# Patient Record
Sex: Male | Born: 2005 | Race: White | Hispanic: No | Marital: Single
Health system: Southern US, Community
[De-identification: ages and names within clinical notes are randomized; demographics above are authoritative.]

---
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
## 2023-09-24 ENCOUNTER — Emergency Department (HOSPITAL_COMMUNITY)
Admission: EM | Admit: 2023-09-24 | Discharge: 2023-09-25 | Disposition: A | Discharge status: Home / Self Care | Payer: 59 | Attending: Emergency Medicine | Admitting: Emergency Medicine

## 2023-09-24 ENCOUNTER — Other Ambulatory Visit: Payer: Self-pay

## 2023-09-24 DIAGNOSIS — D72829 Elevated white blood cell count, unspecified: Secondary | ICD-10-CM | POA: Insufficient documentation

## 2023-09-24 DIAGNOSIS — R197 Diarrhea, unspecified: Secondary | ICD-10-CM | POA: Diagnosis not present

## 2023-09-24 DIAGNOSIS — R1084 Generalized abdominal pain: Secondary | ICD-10-CM | POA: Diagnosis not present

## 2023-09-24 DIAGNOSIS — R112 Nausea with vomiting, unspecified: Secondary | ICD-10-CM | POA: Insufficient documentation

## 2023-09-24 DIAGNOSIS — E876 Hypokalemia: Secondary | ICD-10-CM | POA: Diagnosis not present

## 2023-09-24 LAB — COMPREHENSIVE METABOLIC PANEL
ALT: 36 U/L (ref 0–44)
AST: 19 U/L (ref 15–41)
Albumin: 4.2 g/dL (ref 3.5–5.0)
Alkaline Phosphatase: 92 U/L (ref 38–126)
Anion gap: 13 (ref 5–15)
BUN: 7 mg/dL (ref 6–20)
CO2: 23 mmol/L (ref 22–32)
Calcium: 9.1 mg/dL (ref 8.9–10.3)
Chloride: 102 mmol/L (ref 98–111)
Creatinine, Ser: 0.81 mg/dL (ref 0.61–1.24)
GFR, Estimated: >60 mL/min (ref >60)
Glucose, Bld: 97 mg/dL (ref 70–99)
Potassium: 3.2 mmol/L — ABNORMAL LOW (ref 3.5–5.1)
Sodium: 138 mmol/L (ref 135–145)
Total Bilirubin: 1.2 mg/dL — ABNORMAL HIGH (ref <1.2)
Total Protein: 7.6 g/dL (ref 6.5–8.1)

## 2023-09-24 LAB — CBC WITH DIFFERENTIAL/PLATELET
Abs Immature Granulocytes: 0.06 10*3/uL (ref 0.00–0.07)
Basophils Absolute: 0 10*3/uL (ref 0.0–0.1)
Basophils Relative: 0 %
Eosinophils Absolute: 0 10*3/uL (ref 0.0–0.5)
Eosinophils Relative: 0 %
HCT: 43.6 % (ref 39.0–52.0)
Hemoglobin: 15.2 g/dL (ref 13.0–17.0)
Immature Granulocytes: 1 %
Lymphocytes Relative: 10 %
Lymphs Abs: 1.1 10*3/uL (ref 0.7–4.0)
MCH: 29.1 pg (ref 26.0–34.0)
MCHC: 34.9 g/dL (ref 30.0–36.0)
MCV: 83.5 fL (ref 80.0–100.0)
Monocytes Absolute: 0.6 10*3/uL (ref 0.1–1.0)
Monocytes Relative: 6 %
Neutro Abs: 9.4 10*3/uL — ABNORMAL HIGH (ref 1.7–7.7)
Neutrophils Relative %: 83 %
Platelets: 332 10*3/uL (ref 150–400)
RBC: 5.22 MIL/uL (ref 4.22–5.81)
RDW: 12.4 % (ref 11.5–15.5)
WBC: 11.2 10*3/uL — ABNORMAL HIGH (ref 4.0–10.5)
nRBC: 0 % (ref 0.0–0.2)

## 2023-09-24 LAB — LIPASE, BLOOD: Lipase: 33 U/L (ref 11–51)

## 2023-09-24 MED ORDER — ONDANSETRON HCL 4 MG/2ML IJ SOLN
4.0000 mg | Freq: Once | INTRAMUSCULAR | Status: AC
  Administered 2023-09-24: 4 mg via INTRAVENOUS
  Filled 2023-09-24: qty 2

## 2023-09-24 MED ORDER — FENTANYL CITRATE PF 50 MCG/ML IJ SOSY
50.0000 ug | PREFILLED_SYRINGE | Freq: Once | INTRAMUSCULAR | Status: AC
Start: 2023-09-24 — End: 2023-09-24
  Administered 2023-09-24: 50 ug via INTRAVENOUS
  Filled 2023-09-24: qty 1

## 2023-09-24 NOTE — ED Triage Notes (Signed)
Pt arrived POV d/t RLQ pain with N/V/D - onset 0300 but worsened at 1800 with pain/ pressure.

## 2023-09-24 NOTE — ED Provider Triage Note (Signed)
Emergency Medicine Provider Triage Evaluation Note  Jerome Le , a 17 y.o. male  was evaluated in triage.  Pt complains of right lower quadrant abdominal pain onset today, does not radiate, no testicle or flank pain, no urinary symptoms, no previous surgeries to the abdomen. Vomiting  x 2.  Review of Systems  Positive: Abdominal pain Negative: fever  Physical Exam  BP 129/61 (BP Location: Right Arm)   Pulse 80   Temp 98.9 F (37.2 C) (Oral)   Resp 17   SpO2 98%  Gen:   Awake, no distress   Resp:  Normal effort  MSK:   Moves extremities without difficulty  Other:  RLQ tenderness  Medical Decision Making  Medically screening exam initiated at 8:23 PM.  Appropriate orders placed.  Jerome Le was informed that the remainder of the evaluation will be completed by another provider, this initial triage assessment does not replace that evaluation, and the importance of remaining in the ED until their evaluation is complete.     Arthor Captain, PA-C 09/24/23 2025

## 2023-09-25 ENCOUNTER — Emergency Department (HOSPITAL_COMMUNITY): Payer: 59

## 2023-09-25 ENCOUNTER — Other Ambulatory Visit (HOSPITAL_COMMUNITY): Payer: 59

## 2023-09-25 LAB — URINALYSIS, ROUTINE W REFLEX MICROSCOPIC
Bilirubin Urine: NEGATIVE
Glucose, UA: NEGATIVE mg/dL
Hgb urine dipstick: NEGATIVE
Ketones, ur: NEGATIVE mg/dL
Leukocytes,Ua: NEGATIVE
Nitrite: NEGATIVE
Protein, ur: NEGATIVE mg/dL
Specific Gravity, Urine: >1.046 — ABNORMAL HIGH (ref 1.005–1.030)
pH: 5 (ref 5.0–8.0)

## 2023-09-25 MED ORDER — ONDANSETRON 4 MG PO TBDP: 4.0000 mg | ORAL_TABLET | Freq: Three times a day (TID) | ORAL | 0 refills | Status: AC | PRN

## 2023-09-25 MED ORDER — IOHEXOL 350 MG/ML SOLN
75.0000 mL | Freq: Once | INTRAVENOUS | Status: AC | PRN
  Administered 2023-09-25: 75 mL via INTRAVENOUS

## 2023-09-25 MED ORDER — ONDANSETRON HCL 4 MG/2ML IJ SOLN
4.0000 mg | Freq: Once | INTRAMUSCULAR | Status: AC
Start: 2023-09-25 — End: 2023-09-25
  Administered 2023-09-25: 4 mg via INTRAVENOUS
  Filled 2023-09-25: qty 2

## 2023-09-25 MED ORDER — KETOROLAC TROMETHAMINE 30 MG/ML IJ SOLN
30.0000 mg | Freq: Once | INTRAMUSCULAR | Status: AC
  Administered 2023-09-25: 30 mg via INTRAVENOUS
  Filled 2023-09-25: qty 1

## 2023-09-25 NOTE — ED Provider Notes (Signed)
[facility] Emergency Department Provider Note MRN:  [id number]  Arrival date & time: 09/25/23     Chief Complaint   Abdominal Pain   History of Present Illness   Jerome Le is a 17 y.o. year-old male presents to the ED with chief complaint of nausea, vomiting, and diarrhea.  He reports associated abdominal cramps and generalized fatigue and body aches.  Denies any fever or chills.  SO recently had rotovirus.  Patient received zofran and fentanyl in triage.  History provided by patient.   Review of Systems  Pertinent positive and negative review of systems noted in HPI.    Physical Exam   Vitals:   09/24/23 2006 09/25/23 0239  BP: 129/61 120/73  Pulse: 80 73  Resp: 17 16  Temp: 98.9 F (37.2 C) 98.8 F (37.1 C)  SpO2: 98% 100%    CONSTITUTIONAL:  non toxic-appearing, NAD NEURO:  Alert and oriented x 3, CN 3-12 grossly intact EYES:  eyes equal and reactive ENT/NECK:  Supple, no stridor  CARDIO:  normal rate, regular rhythm, appears well-perfused  PULM:  No respiratory distress, CTAB GI/GU:  non-distended, no focal tenderness, rebound, or guarding.  There is generalized discomfort MSK/SPINE:  No gross deformities, no edema, moves all extremities  SKIN:  no rash, atraumatic   *Additional and/or pertinent findings included in MDM below  Diagnostic and Interventional Summary    EKG Interpretation Date/Time:    Ventricular Rate:    PR Interval:    QRS Duration:    QT Interval:    QTC Calculation:   R Axis:      Text Interpretation:         Labs Reviewed  COMPREHENSIVE METABOLIC PANEL - Abnormal; Notable for the following components:      Result Value   Potassium 3.2 (*)    Total Bilirubin 1.2 (*)    All other components within normal limits  CBC WITH DIFFERENTIAL/PLATELET - Abnormal; Notable for the following components:   WBC 11.2 (*)    Neutro Abs 9.4 (*)    All other components within normal limits  URINALYSIS, ROUTINE  W REFLEX MICROSCOPIC - Abnormal; Notable for the following components:   Specific Gravity, Urine >1.046 (*)    All other components within normal limits  LIPASE, BLOOD    CT ABDOMEN PELVIS W CONTRAST  Final Result      Medications  fentaNYL (SUBLIMAZE) injection 50 mcg (50 mcg Intravenous Given 09/24/23 2116)  ondansetron (ZOFRAN) injection 4 mg (4 mg Intravenous Given 09/24/23 2116)  iohexol (OMNIPAQUE) 350 MG/ML injection 75 mL (75 mLs Intravenous Contrast Given 09/25/23 0017)  ondansetron (ZOFRAN) injection 4 mg (4 mg Intravenous Given 09/25/23 0358)  ketorolac (TORADOL) 30 MG/ML injection 30 mg (30 mg Intravenous Given 09/25/23 0358)     Procedures  /  Critical Care Procedures  ED Course and Medical Decision Making  I have reviewed the triage vital signs, the nursing notes, and pertinent available records from the EMR.  Social Determinants Affecting Complexity of Care: Patient has no clinically significant social determinants affecting this chief complaint..   ED Course: Clinical Course as of 09/25/23 0508  Tue Sep 25, 2023  0506 Comprehensive metabolic panel(!) Mild hypokalemia, has had some diarrhea.  Will likely resolve with resolution of diarrhea [RB]  0507 CBC with Differential(!) Mild nonspecific leukocytosis, likely reactive [RB]  0507 Urinalysis, Routine w reflex microscopic -Urine, Clean Catch(!) UA not consistent with infection [RB]    Clinical Course User Index [  RB] Roxy Horseman, PA-C    Medical Decision Making Patient here with n/v/d that started yesterday.  He has associated RLQ pain.  CT and labs ordered in triage are reassuring.  Normal appendix.  No kidney stone seen.    Patient is now tolerating PO.  He is sitting up in bed smiling.  He has been able to ambulate to the bathroom without difficulty.  Amount and/or Complexity of Data Reviewed Labs: ordered.  Risk Prescription drug management.         Consultants: No consultations were  needed in caring for this patient.   Treatment and Plan: Emergency department workup does not suggest an emergent condition requiring admission or immediate intervention beyond  what has been performed at this time. The patient is safe for discharge and has  been instructed to return immediately for worsening symptoms, change in  symptoms or any other concerns    Final Clinical Impressions(s) / ED Diagnoses     ICD-10-CM   1. Nausea vomiting and diarrhea  R11.2    R19.7       ED Discharge Orders          Ordered    ondansetron (ZOFRAN-ODT) 4 MG disintegrating tablet  Every 8 hours PRN        09/25/23 0503              Discharge Instructions Discussed with and Provided to Patient:     Discharge Instructions      Your tests looked good.  You might have a small inguinal hernia.  You should follow-up with your doctor or the surgeon listed for evaluation.  You blood work and urine looked fine.    Please take the zofran as prescribed.  Stay hydrated.  Ease back into a normal diet.  Take Tylenol or Ibuprofen for pain.       Roxy Horseman, PA-C 09/25/23 6295    Zadie Rhine, MD 09/25/23 [phone]

## 2023-09-25 NOTE — Discharge Instructions (Addendum)
Your tests looked good.  You might have a small inguinal hernia.  You should follow-up with your doctor or the surgeon listed for evaluation.  You blood work and urine looked fine.    Please take the zofran as prescribed.  Stay hydrated.  Ease back into a normal diet.  Take Tylenol or Ibuprofen for pain.

## 2024-04-26 ENCOUNTER — Emergency Department (HOSPITAL_COMMUNITY)
Admission: EM | Admit: 2024-04-26 | Discharge: 2024-04-27 | Disposition: A | Discharge status: Home / Self Care | Attending: Emergency Medicine | Admitting: Emergency Medicine

## 2024-04-26 DIAGNOSIS — H60503 Unspecified acute noninfective otitis externa, bilateral: Secondary | ICD-10-CM

## 2024-04-26 DIAGNOSIS — H6093 Unspecified otitis externa, bilateral: Secondary | ICD-10-CM | POA: Insufficient documentation

## 2024-04-26 DIAGNOSIS — H9203 Otalgia, bilateral: Secondary | ICD-10-CM | POA: Diagnosis present

## 2024-04-27 ENCOUNTER — Other Ambulatory Visit: Payer: Self-pay

## 2024-04-27 ENCOUNTER — Encounter (HOSPITAL_COMMUNITY): Payer: Self-pay | Admitting: *Deleted

## 2024-04-27 MED ORDER — KETOROLAC TROMETHAMINE 60 MG/2ML IM SOLN
60.0000 mg | Freq: Once | INTRAMUSCULAR | Status: AC
  Administered 2024-04-27: 60 mg via INTRAMUSCULAR
  Filled 2024-04-27: qty 2

## 2024-04-27 MED ORDER — NEOMYCIN-POLYMYXIN-HC 3.5-10000-1 OT SUSP: 4.0000 [drp] | Freq: Four times a day (QID) | OTIC | 0 refills | Status: AC

## 2024-04-27 MED ORDER — NEOMYCIN-POLYMYXIN-HC 3.5-10000-1 OT SUSP: 4.0000 [drp] | Freq: Four times a day (QID) | OTIC | 0 refills | Status: DC

## 2024-04-27 NOTE — ED Provider Notes (Signed)
 [facility] EMERGENCY DEPARTMENT AT [facility] Provider Note   CSN: [id number] Arrival date & time: 04/26/24  2342     Patient presents with: Otalgia   Jerome Le is a 18 y.o. male.   The history is provided by the patient.  Otalgia Location:  Bilateral Behind ear:  No abnormality Quality:  Aching Severity:  Severe Onset quality:  Sudden Timing:  Constant Progression:  Unchanged Chronicity:  New Context: not direct blow   Relieved by:  Nothing Worsened by:  Nothing Ineffective treatments:  None tried Associated symptoms: no abdominal pain, no congestion, no cough, no diarrhea, no ear discharge, no hearing loss and no rhinorrhea   Risk factors: no recent travel   Patient seen today for same at urgent care and started on unknown antibiotic drops and has used on dose and still in pain.       Prior to Admission medications   Medication Sig Start Date End Date Taking? Authorizing Provider  ondansetron  (ZOFRAN -ODT) 4 MG disintegrating tablet Take 1 tablet (4 mg total) by mouth every 8 (eight) hours as needed for nausea or vomiting. 09/25/23   Vicky Charleston, PA-C    Allergies: Cefdinir and Milk protein    Review of Systems  HENT:  Positive for ear pain. Negative for congestion, drooling, ear discharge, hearing loss and rhinorrhea.   Respiratory:  Negative for cough.   Gastrointestinal:  Negative for abdominal pain and diarrhea.  All other systems reviewed and are negative.   Updated Vital Signs BP 123/69 (BP Location: Left Arm)   Pulse 72   Temp 98.4 F (36.9 C)   Resp 16   Ht 5' 5 (1.651 m)   Wt 81.6 kg   SpO2 99%   BMI 29.94 kg/m   Physical Exam Vitals and nursing note reviewed.  Constitutional:      General: He is not in acute distress.    Appearance: He is well-developed. He is not diaphoretic.  HENT:     Head: Normocephalic and atraumatic.     Right Ear: Tympanic membrane normal.     Left Ear: Tympanic membrane normal.     Ears:      Comments: Fluid and debris in canals without erythema. Auricles in normal position.  No swelling or tenderness over the mastoid  Eyes:     Conjunctiva/sclera: Conjunctivae normal.     Pupils: Pupils are equal, round, and reactive to light.  Cardiovascular:     Rate and Rhythm: Normal rate and regular rhythm.     Pulses: Normal pulses.     Heart sounds: Normal heart sounds.  Pulmonary:     Effort: Pulmonary effort is normal.     Breath sounds: Normal breath sounds. No wheezing or rales.  Abdominal:     General: Bowel sounds are normal.     Palpations: Abdomen is soft.     Tenderness: There is no abdominal tenderness. There is no guarding or rebound.  Musculoskeletal:        General: Normal range of motion.     Cervical back: Normal range of motion and neck supple.  Skin:    General: Skin is warm and dry.     Capillary Refill: Capillary refill takes less than 2 seconds.  Neurological:     General: No focal deficit present.     Mental Status: He is alert and oriented to person, place, and time.     Deep Tendon Reflexes: Reflexes normal.  Psychiatric:  Mood and Affect: Mood normal.     (all labs ordered are listed, but only abnormal results are displayed) Labs Reviewed - No data to display  EKG: None  Radiology: No results found.   Procedures   Medications Ordered in the ED  ketorolac  (TORADOL ) injection 60 mg (has no administration in time range)                                    Medical Decision Making B ear canal pain   Amount and/or Complexity of Data Reviewed Independent Historian: friend    Details: See above  External Data Reviewed: notes.    Details: Previous notes reviewed   Risk Prescription drug management. Risk Details: B otitis externa.  Will start cortisporin Otic suspension.  No otitis media.  No findings of mastoiditis.  Recommend flonase and zyrtec and have advised close follow up. Stable for discharge.       Final diagnoses:   Acute otitis externa of both ears, unspecified type   No signs of systemic illness or infection. The patient is nontoxic-appearing on exam and vital signs are within normal limits.  I have reviewed the triage vital signs and the nursing notes. Pertinent labs & imaging results that were available during my care of the patient were reviewed by me and considered in my medical decision making (see chart for details). After history, exam, and medical workup I feel the patient has been appropriately medically screened and is safe for discharge home. Pertinent diagnoses were discussed with the patient. Patient was given return precautions.    ED Discharge Orders     None          Beckem Tomberlin, MD 04/27/24 [id number]

## 2024-04-27 NOTE — ED Triage Notes (Signed)
 The pt has ha d a rt earache for 5 days he was seen at urgent care and he was given med but the pain  is worse   he took ibu  600mg  one hour ago

## 2024-04-27 NOTE — Discharge Instructions (Addendum)
 Start Flonase trwice daily and zyrtec daily. Follow up with ENT
